# Patient Record
Sex: Female | Born: 2000 | Race: White | Hispanic: No | Marital: Single | Smoking: Never smoker
Health system: Southern US, Community
[De-identification: ages and names within clinical notes are randomized; demographics above are authoritative.]

## PROBLEM LIST (undated history)

## (undated) DIAGNOSIS — N946 Dysmenorrhea, unspecified: Secondary | ICD-10-CM

## (undated) DIAGNOSIS — Z23 Encounter for immunization: Secondary | ICD-10-CM

## (undated) DIAGNOSIS — F32A Depression, unspecified: Secondary | ICD-10-CM

## (undated) DIAGNOSIS — F419 Anxiety disorder, unspecified: Secondary | ICD-10-CM

## (undated) HISTORY — DX: Encounter for immunization: Z23

## (undated) HISTORY — DX: Anxiety disorder, unspecified: F41.9

## (undated) HISTORY — DX: Dysmenorrhea, unspecified: N94.6

## (undated) HISTORY — DX: Depression, unspecified: F32.A

---
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
## 2021-03-23 ENCOUNTER — Other Ambulatory Visit: Payer: Self-pay

## 2021-03-23 ENCOUNTER — Ambulatory Visit
Admission: RE | Admit: 2021-03-23 | Discharge: 2021-03-23 | Disposition: A | Discharge status: Home / Self Care | Payer: Commercial Managed Care - PPO | Source: Ambulatory Visit

## 2021-03-23 VITALS — BP 136/84 | HR 88 | Temp 99.1°F | Resp 20

## 2021-03-23 DIAGNOSIS — L03115 Cellulitis of right lower limb: Secondary | ICD-10-CM

## 2021-03-23 MED ORDER — SULFAMETHOXAZOLE-TRIMETHOPRIM 800-160 MG PO TABS: 1.0000 | ORAL_TABLET | Freq: Two times a day (BID) | ORAL | 0 refills | Status: AC

## 2021-03-23 NOTE — ED Triage Notes (Signed)
Pt here with rash to right lateral lower leg x 3 days. It is warm to touch, deep erythema and pain is worsening and spreading.

## 2021-03-23 NOTE — Discharge Instructions (Addendum)
Take the antibiotic as directed.  Follow up with your primary care provider for a recheck in 2-3 days.  Go to the emergency department if you have increased redness or other signs of infection.

## 2021-03-23 NOTE — ED Provider Notes (Signed)
[facility]    CSN: [id number] Arrival date & time: 03/23/21  1145      History   Chief Complaint Chief Complaint  Patient presents with   Rash    HPI Kaitlin Larsen is a 21 y.o. adult.  Patient presents with redness and pain on right lower leg x3 days.  No known injury.  The area started out as a possible insect bite but has gotten more painful and red.  No fever, open wounds, drainage, numbness, weakness, paresthesias, or other symptoms.  No treatments at home.  The history is provided by the patient.   Past Medical History:  Diagnosis Date   Dysmenorrhea in adolescent    improved with OCPs   Vaccine for human papilloma virus (HPV) types 6, 11, 16, and 18 administered     Patient Active Problem List   Diagnosis Date Noted   Dysmenorrhea in adolescent 05/24/2017    History reviewed. No pertinent surgical history.  OB History     Gravida  0   Para  0   Term  0   Preterm  0   AB  0   Living  0      SAB  0   IAB  0   Ectopic  0   Multiple  0   Live Births  0            Home Medications    Prior to Admission medications   Medication Sig Start Date End Date Taking? Authorizing Provider  sulfamethoxazole-trimethoprim (BACTRIM DS) 800-160 MG tablet Take 1 tablet by mouth 2 (two) times daily for 7 days. 03/23/21 03/30/21 Yes Mickie Bail, NP  benzonatate (TESSALON) 100 MG capsule Take 1 capsule (100 mg total) by mouth 3 (three) times daily as needed for cough. 12/05/20   Mickie Bail, NP  sertraline (ZOLOFT) 100 MG tablet Take 100 mg by mouth 2 (two) times daily. 03/22/20   [provider]  testosterone cypionate (DEPOTESTOSTERONE CYPIONATE) 200 MG/ML injection SMARTSIG:0.4 Milliliter(s) SUB-Q Every 2 Weeks 03/03/21   [provider]    Family History Family History  Problem Relation Age of Onset   Hyperlipidemia Mother    Hypertension Mother    Heart disease Maternal Grandmother    Breast cancer Neg Hx    Ovarian  cancer Neg Hx     Social History Social History   Tobacco Use   Smoking status: Never   Smokeless tobacco: Never  Vaping Use   Vaping Use: Never used  Substance Use Topics   Alcohol use: Never   Drug use: Never     Allergies   Patient has no known allergies.   Review of Systems Review of Systems  Constitutional:  Negative for chills and fever.  Musculoskeletal:  Negative for arthralgias, gait problem and joint swelling.  Skin:  Positive for color change and wound.  Neurological:  Negative for weakness and numbness.  All other systems reviewed and are negative.   Physical Exam Triage Vital Signs ED Triage Vitals  Enc Vitals Group     BP      Pulse      Resp      Temp      Temp src      SpO2      Weight      Height      Head Circumference      Peak Flow      Pain Score      Pain Loc  Pain Edu?      Excl. in GC?    No data found.  Updated Vital Signs BP 136/84    Pulse 88    Temp 99.1 F (37.3 C)    Resp 20    SpO2 98%   Visual Acuity Right Eye Distance:   Left Eye Distance:   Bilateral Distance:    Right Eye Near:   Left Eye Near:    Bilateral Near:     Physical Exam Vitals and nursing note reviewed.  Constitutional:      General: He is not in acute distress.    Appearance: He is well-developed. He is not ill-appearing.  HENT:     Mouth/Throat:     Mouth: Mucous membranes are moist.  Cardiovascular:     Rate and Rhythm: Normal rate and regular rhythm.     Heart sounds: Normal heart sounds.  Pulmonary:     Effort: Pulmonary effort is normal. No respiratory distress.     Breath sounds: Normal breath sounds.  Musculoskeletal:        General: No swelling or deformity. Normal range of motion.     Cervical back: Neck supple.  Skin:    General: Skin is warm and dry.     Capillary Refill: Capillary refill takes less than 2 seconds.     Findings: Erythema present.     Comments: 1 cm area of tender induration with surrounding erythema.  See  pictures.   Neurological:     General: No focal deficit present.     Mental Status: He is alert and oriented to person, place, and time.     Sensory: No sensory deficit.     Motor: No weakness.     Gait: Gait normal.  Psychiatric:        Mood and Affect: Mood normal.        Behavior: Behavior normal.        UC Treatments / Results  Labs (all labs ordered are listed, but only abnormal results are displayed) Labs Reviewed - No data to display  EKG   Radiology No results found.  Procedures Procedures (including critical care time)  Medications Ordered in UC Medications - No data to display  Initial Impression / Assessment and Plan / UC Course  I have reviewed the triage vital signs and the nursing notes.  Pertinent labs & imaging results that were available during my care of the patient were reviewed by me and considered in my medical decision making (see chart for details).    Cellulitis of right lower leg, possibly due to insect bite.  Treating with Bactrim.  Discussed signs of worsening infection.  Instructed patient to follow-up with PCP for recheck in 2 to 3 days.  ED precautions discussed and education provided on cellulitis.  Patient agrees to plan of care.  Final Clinical Impressions(s) / UC Diagnoses   Final diagnoses:  Cellulitis of right lower leg     Discharge Instructions      Take the antibiotic as directed.  Follow up with your primary care provider for a recheck in 2-3 days.  Go to the emergency department if you have increased redness or other signs of infection.         ED Prescriptions     Medication Sig Dispense Auth. Provider   sulfamethoxazole-trimethoprim (BACTRIM DS) 800-160 MG tablet Take 1 tablet by mouth 2 (two) times daily for 7 days. 14 tablet Mickie Bail, NP      PDMP  not reviewed this encounter.   Mickie Bail, NP 03/23/21 1245

## 2021-09-16 IMAGING — CR DG ABDOMEN 1V
1 series · 2 of 2 positions shown · non-contrast
Comparison: None.

CLINICAL DATA: Left lower quadrant pain

EXAM:
ABDOMEN - 1 VIEW

[Series 1: t abdomen supine · 0.14mm/px · 2 of 2 slices shown]
[im 1/2]
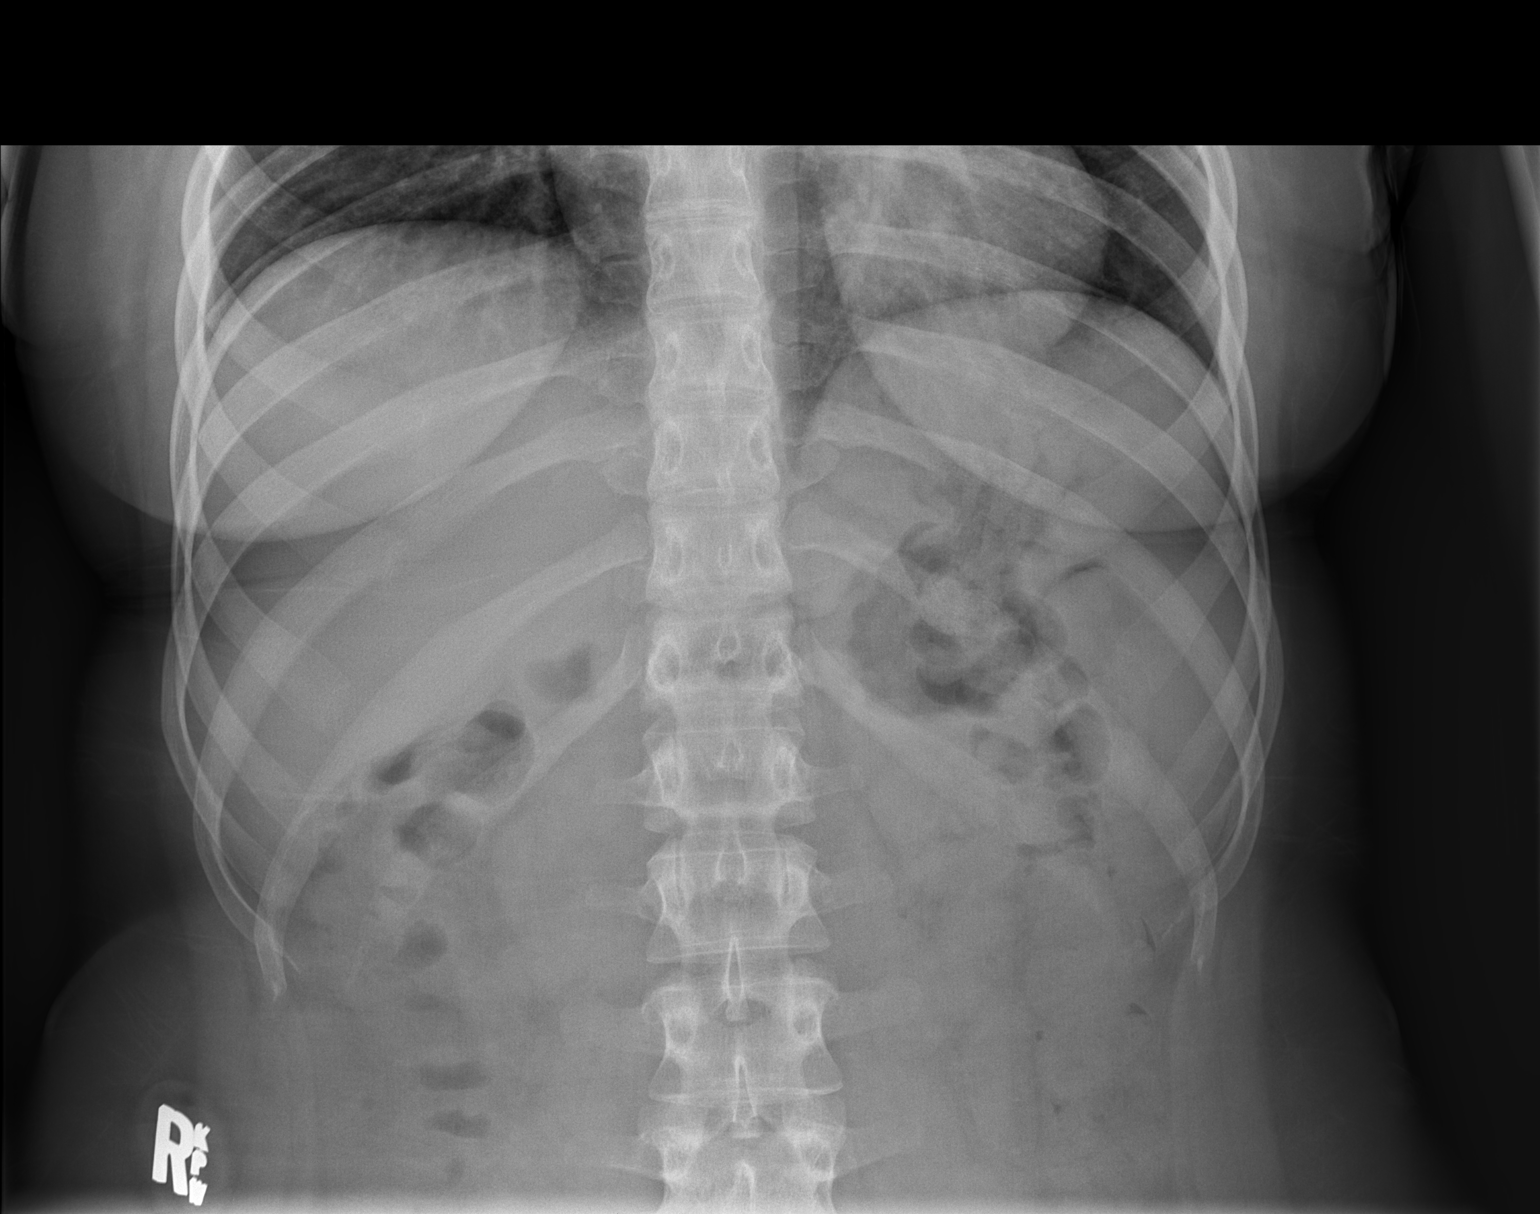
[im 2/2]
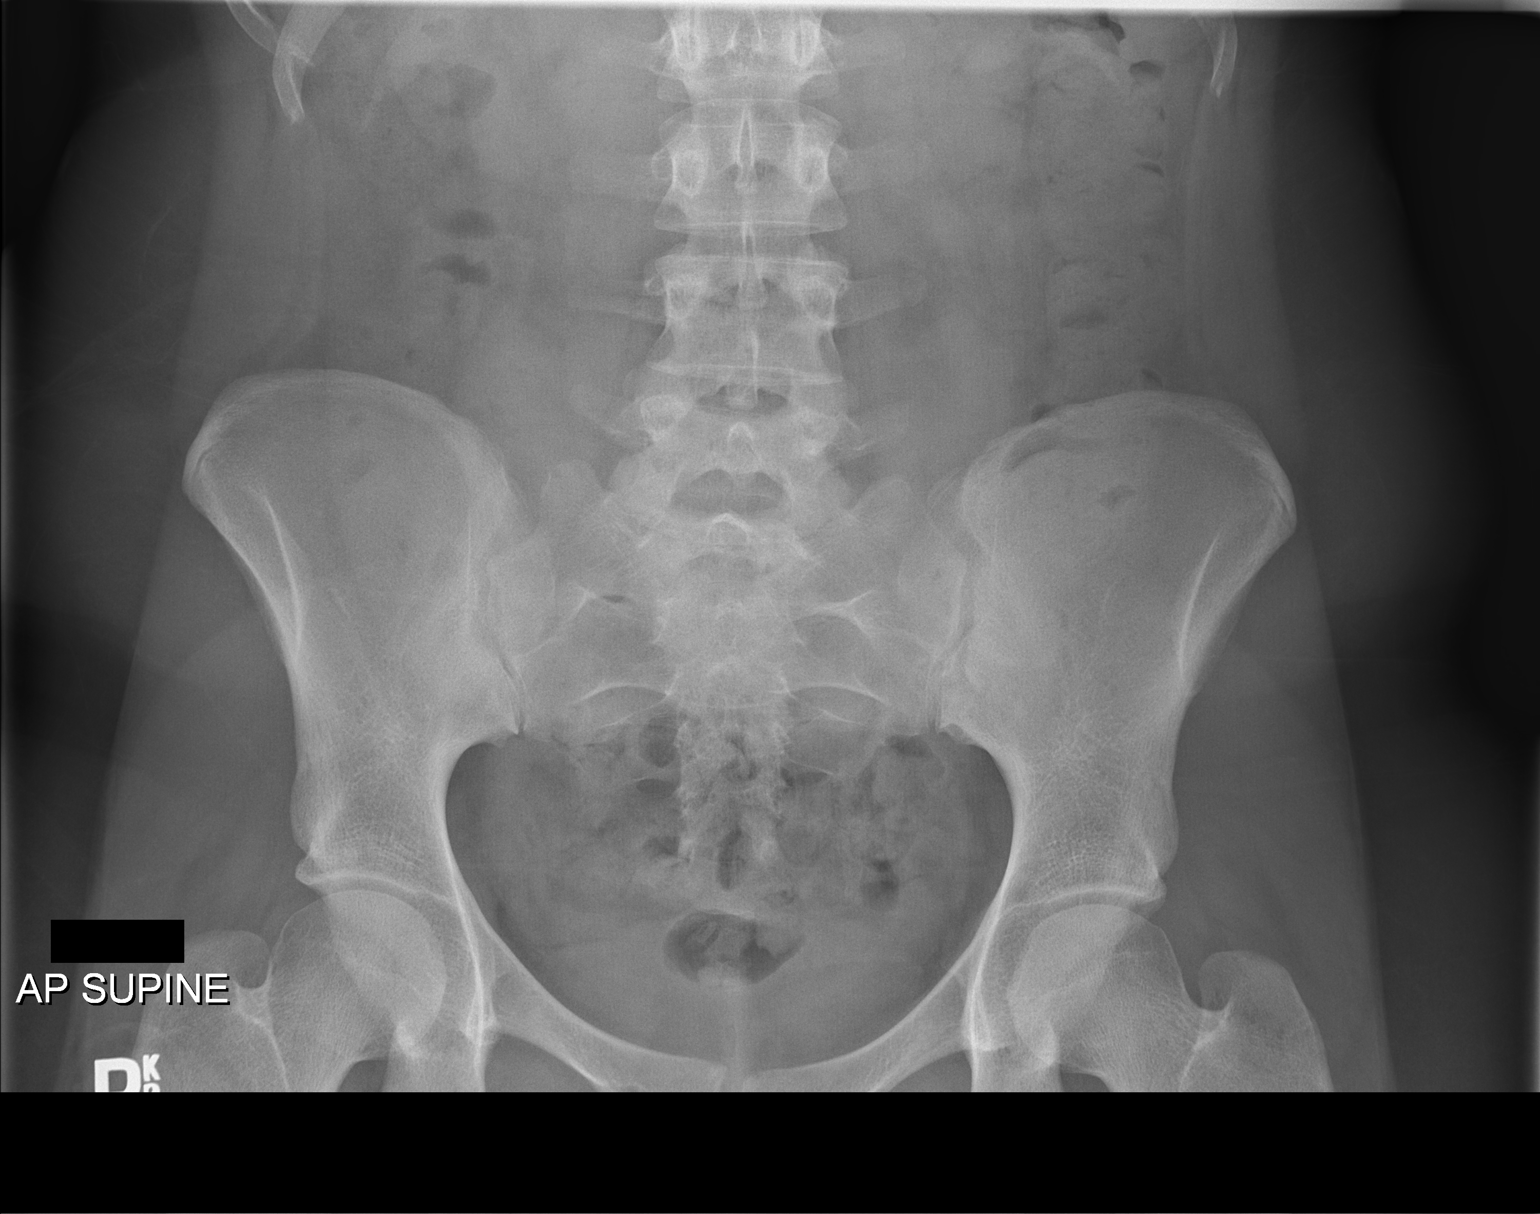

[2 of 2 positions shown; findings below may reference images not displayed]

FINDINGS: The bowel gas pattern is normal. No radio-opaque calculi or other
significant radiographic abnormality are seen.
IMPRESSION: Negative.

## 2021-11-24 ENCOUNTER — Other Ambulatory Visit: Payer: Self-pay

## 2021-11-24 ENCOUNTER — Encounter: Payer: Self-pay | Admitting: Nurse Practitioner

## 2021-11-24 ENCOUNTER — Ambulatory Visit (INDEPENDENT_AMBULATORY_CARE_PROVIDER_SITE_OTHER): Payer: Commercial Managed Care - PPO | Admitting: Nurse Practitioner

## 2021-11-24 VITALS — BP 124/76 | HR 94 | Temp 97.8°F | Resp 18 | Ht 66.0 in | Wt 221.1 lb

## 2021-11-24 DIAGNOSIS — G43019 Migraine without aura, intractable, without status migrainosus: Secondary | ICD-10-CM | POA: Diagnosis not present

## 2021-11-24 DIAGNOSIS — Z13220 Encounter for screening for lipoid disorders: Secondary | ICD-10-CM

## 2021-11-24 DIAGNOSIS — Z131 Encounter for screening for diabetes mellitus: Secondary | ICD-10-CM

## 2021-11-24 DIAGNOSIS — Z1159 Encounter for screening for other viral diseases: Secondary | ICD-10-CM

## 2021-11-24 DIAGNOSIS — F3342 Major depressive disorder, recurrent, in full remission: Secondary | ICD-10-CM | POA: Insufficient documentation

## 2021-11-24 DIAGNOSIS — Z130 Encounter for screening for diseases of the blood and blood-forming organs and certain disorders involving the immune mechanism: Secondary | ICD-10-CM | POA: Diagnosis not present

## 2021-11-24 DIAGNOSIS — F419 Anxiety disorder, unspecified: Secondary | ICD-10-CM

## 2021-11-24 DIAGNOSIS — Z7689 Persons encountering health services in other specified circumstances: Secondary | ICD-10-CM

## 2021-11-24 DIAGNOSIS — Z114 Encounter for screening for human immunodeficiency virus [HIV]: Secondary | ICD-10-CM

## 2021-11-24 MED ORDER — SUMATRIPTAN SUCCINATE 25 MG PO TABS: 25.0000 mg | ORAL_TABLET | ORAL | 0 refills | Status: DC | PRN

## 2021-11-24 NOTE — Assessment & Plan Note (Signed)
In remission, has not had any issues with this for awhile.

## 2021-11-24 NOTE — Progress Notes (Signed)
BP 124/76   Pulse 94   Temp 97.8 F (36.6 C) (Oral)   Resp 18   Ht 5\' 6"  (1.676 m)   Wt 221 lb 1.6 oz (100.3 kg)   SpO2 98%   BMI 35.69 kg/m    Subjective:    Patient ID: Kaitlin Larsen, adult    DOB: [birth year], 21 y.o.   MRN: [id number]  HPI: Kaitlin Larsen is a 21 y.o. adult  Chief Complaint  Patient presents with   Establish Care   Migraine   Establish care:  Patient's last physical was several years ago. Patient history includes anxiety, depression.  Family history includes mom has HTN, HLD, dad no history, no family history of cancer.   Migraines: Patient reports that migraines for years. Used to take excedrin migraine and would help.  Reports recently they have increased.  Excedrin is only helping 50 % of the time.  Denies any aura.  Starts as a dull pain and then increases over a few hours typically on the left side. Does report nausea.  Discussed options would like to try Imitrex.  Discussed administration.    Depression/anxiety:  Patient reports that mood has not been an issue for sometime.  Patient reports used to be on medication but does not need it anymore.     11/24/2021    1:12 PM  Depression screen PHQ 2/9  Decreased Interest 0  Down, Depressed, Hopeless 0  PHQ - 2 Score 0  Altered sleeping 0  Tired, decreased energy 1  Change in appetite 0  Feeling bad or failure about yourself  0  Trouble concentrating 0  Moving slowly or fidgety/restless 0  Suicidal thoughts 0  PHQ-9 Score 1  Difficult doing work/chores Not difficult at all       11/24/2021    1:13 PM  GAD 7 : Generalized Anxiety Score  Nervous, Anxious, on Edge 0  Control/stop worrying 0  Worry too much - different things 0  Trouble relaxing 0  Restless 0  Easily annoyed or irritable 1  Afraid - awful might happen 0  Total GAD 7 Score 1  Anxiety Difficulty Not difficult at all   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  Is [facility] at [facility] in  [place].  They follow all screening including pap and follow up care regarding testosterone.    Relevant past medical, surgical, family and social history reviewed and updated as indicated. Interim medical history since our last visit reviewed. Allergies and medications reviewed and updated.  Review of Systems  Constitutional: Negative for fever or weight change.  Respiratory: Negative for cough and shortness of breath.   Cardiovascular: Negative for chest pain or palpitations.  Gastrointestinal: Negative for abdominal pain, no bowel changes.  Musculoskeletal: Negative for gait problem or joint swelling.  Skin: Negative for rash.  Neurological: Negative for dizziness, positive for headache.  No other specific complaints in a complete review of systems (except as listed in HPI above).      Objective:    BP 124/76   Pulse 94   Temp 97.8 F (36.6 C) (Oral)   Resp 18   Ht 5\' 6"  (1.676 m)   Wt 221 lb 1.6 oz (100.3 kg)   SpO2 98%   BMI 35.69 kg/m   Wt Readings from Last 3 Encounters:  11/24/21 221 lb 1.6 oz (100.3 kg)  04/03/20 188 lb (85.3 kg) (96 %, Z= 1.78)*  09/24/18 177 lb 9.6 oz (80.6 kg) (  95 %, Z= 1.67)*   * Growth percentiles are based on CDC (Girls, 2-20 Years) data.    Physical Exam  Constitutional: Patient appears well-developed and well-nourished. Obese  No distress.  HEENT: head atraumatic, normocephalic, pupils equal and reactive to light, neck supple Cardiovascular: Normal rate, regular rhythm and normal heart sounds.  No murmur heard. No BLE edema. Pulmonary/Chest: Effort normal and breath sounds normal. No respiratory distress. Abdominal: Soft.  There is no tenderness. Psychiatric: Patient has a normal mood and affect. behavior is normal. Judgment and thought content normal.     Assessment & Plan:   Problem List Items Addressed This Visit       Cardiovascular and Mediastinum   Intractable migraine without aura and without status migrainosus - Primary     Patient reports migraines worsening excedrin migraine not helping any more. Will trial imitrex.  Discussed preventative care patient would like to hold off on that at this time.       Relevant Medications   SUMAtriptan (IMITREX) 25 MG tablet     Other   Recurrent major depressive disorder, in full remission ([place])    In remission, has not had any issues with this for awhile.       Anxiety    In remission, has not had any issues with this for awhile.       Other Visit Diagnoses     Screening for diabetes mellitus       Relevant Orders   COMPLETE METABOLIC PANEL WITH GFR   Hemoglobin A1c   Screening for cholesterol level       Relevant Orders   Lipid panel   Screening for deficiency anemia       Relevant Orders   CBC with Differential/Platelet   Encounter for hepatitis C screening test for low risk patient       Relevant Orders   Hepatitis C antibody   Screening for HIV without presence of risk factors       Relevant Orders   HIV Antibody (routine testing w rflx)   Encounter to establish care       schedule cpe         Follow up plan: Return in about 6 months (around 05/26/2022) for cpe.

## 2021-11-24 NOTE — Assessment & Plan Note (Signed)
In remission, has not had any issues with this for awhile.  

## 2021-11-24 NOTE — Assessment & Plan Note (Signed)
Patient reports migraines worsening excedrin migraine not helping any more. Will trial imitrex.  Discussed preventative care patient would like to hold off on that at this time.

## 2021-11-25 LAB — COMPLETE METABOLIC PANEL WITH GFR
AG Ratio: 1.8 (calc) (ref 1.0–2.5)
ALT: 19 U/L (ref 6–29)
AST: 20 U/L (ref 10–30)
Albumin: 4.6 g/dL (ref 3.6–5.1)
Alkaline phosphatase (APISO): 78 U/L (ref 31–125)
BUN/Creatinine Ratio: 10 (calc) (ref 6–22)
BUN: 10 mg/dL (ref 7–25)
CO2: 24 mmol/L (ref 20–32)
Calcium: 9.8 mg/dL (ref 8.6–10.2)
Chloride: 103 mmol/L (ref 98–110)
Creat: 0.99 mg/dL — ABNORMAL HIGH (ref 0.50–0.96)
Globulin: 2.5 g/dL (calc) (ref 1.9–3.7)
Glucose, Bld: 82 mg/dL (ref 65–99)
Potassium: 4.2 mmol/L (ref 3.5–5.3)
Sodium: 138 mmol/L (ref 135–146)
Total Bilirubin: 0.4 mg/dL (ref 0.2–1.2)
Total Protein: 7.1 g/dL (ref 6.1–8.1)
eGFR: 84 mL/min/{1.73_m2} (ref >=60)

## 2021-11-25 LAB — LIPID PANEL
Cholesterol: 152 mg/dL (ref <200)
HDL: 37 mg/dL — ABNORMAL LOW (ref >=50)
LDL Cholesterol (Calc): 93 mg/dL (calc)
Non-HDL Cholesterol (Calc): 115 mg/dL (calc) (ref <130)
Total CHOL/HDL Ratio: 4.1 (calc) (ref <5.0)
Triglycerides: 127 mg/dL (ref <150)

## 2021-11-25 LAB — CBC WITH DIFFERENTIAL/PLATELET
Absolute Monocytes: 618 cells/uL (ref 200–950)
Basophils Absolute: 12 cells/uL (ref 0–200)
Basophils Relative: 0.2 %
Eosinophils Absolute: 108 cells/uL (ref 15–500)
Eosinophils Relative: 1.8 %
HCT: 46.5 % — ABNORMAL HIGH (ref 35.0–45.0)
Hemoglobin: 15.4 g/dL (ref 11.7–15.5)
Lymphs Abs: 2670 cells/uL (ref 850–3900)
MCH: 28.6 pg (ref 27.0–33.0)
MCHC: 33.1 g/dL (ref 32.0–36.0)
MCV: 86.4 fL (ref 80.0–100.0)
MPV: 9.6 fL (ref 7.5–12.5)
Monocytes Relative: 10.3 %
Neutro Abs: 2592 cells/uL (ref 1500–7800)
Neutrophils Relative %: 43.2 %
Platelets: 247 10*3/uL (ref 140–400)
RBC: 5.38 10*6/uL — ABNORMAL HIGH (ref 3.80–5.10)
RDW: 14.2 % (ref 11.0–15.0)
Total Lymphocyte: 44.5 %
WBC: 6 10*3/uL (ref 3.8–10.8)

## 2021-11-25 LAB — HIV ANTIBODY (ROUTINE TESTING W REFLEX): HIV 1&2 Ab, 4th Generation: NONREACTIVE

## 2021-11-25 LAB — HEMOGLOBIN A1C
Hgb A1c MFr Bld: 5 % of total Hgb (ref <5.7)
Mean Plasma Glucose: 97 mg/dL
eAG (mmol/L): 5.4 mmol/L

## 2021-11-25 LAB — HEPATITIS C ANTIBODY: Hepatitis C Ab: NONREACTIVE

## 2021-12-21 ENCOUNTER — Other Ambulatory Visit: Payer: Self-pay | Admitting: Nurse Practitioner

## 2021-12-21 DIAGNOSIS — G43019 Migraine without aura, intractable, without status migrainosus: Secondary | ICD-10-CM

## 2021-12-21 NOTE — Telephone Encounter (Signed)
Requested Prescriptions  Pending Prescriptions Disp Refills   SUMAtriptan (IMITREX) 25 MG tablet [Pharmacy Med Name: SUMATRIPTAN SUCC 25 MG TABLET] 10 tablet 1    Sig: TAKE 1 EVERY 2 HOURS AS NEEDED FOR MIGRAINE. MAY REPEAT IN 2 HOURS IF HEADACHE PERSISTS OR RECURS.     Neurology:  Migraine Therapy - Triptan Passed - 12/21/2021  2:05 AM      Passed - Last BP in normal range    BP Readings from Last 1 Encounters:  11/24/21 124/76         Passed - Valid encounter within last 12 months    Recent Outpatient Visits           3 weeks ago Intractable migraine without aura and without status migrainosus   [facility] Berniece Salines, FNP   1 year ago No-show for appointment   [facility] Flinchum, Eula Fried, FNP       Future Appointments             In 5 months Zane Herald, Rudolpho Sevin, FNP [facility], [facility]

## 2022-05-08 ENCOUNTER — Ambulatory Visit
Admission: EM | Admit: 2022-05-08 | Discharge: 2022-05-08 | Disposition: A | Discharge status: Home / Self Care | Payer: Commercial Managed Care - PPO

## 2022-05-08 DIAGNOSIS — H6692 Otitis media, unspecified, left ear: Secondary | ICD-10-CM | POA: Diagnosis not present

## 2022-05-08 DIAGNOSIS — J0100 Acute maxillary sinusitis, unspecified: Secondary | ICD-10-CM | POA: Diagnosis not present

## 2022-05-08 DIAGNOSIS — J209 Acute bronchitis, unspecified: Secondary | ICD-10-CM

## 2022-05-08 MED ORDER — AMOXICILLIN 875 MG PO TABS: 875.0000 mg | ORAL_TABLET | Freq: Two times a day (BID) | ORAL | 0 refills | Status: AC

## 2022-05-08 NOTE — ED Triage Notes (Signed)
Patient presents to UC for sore throat, runny nose, rib pain with  coughing since 4 days. Had virtual visit on Thursday was treated with symptoms with prednisone, cough meds with no improvement.

## 2022-05-08 NOTE — ED Provider Notes (Signed)
[facility]    CSN: [id number] Arrival date & time: 05/08/22  1406      History   Chief Complaint Chief Complaint  Patient presents with   Cough   Nasal Congestion   Sore Throat    HPI Kaitlin Larsen is a 22 y.o. adult.  Patient presents with 4-day history of runny nose, sore throat, cough.  Treated with prednisone and Tessalon Perles from a virtual visit on 05/05/2022.  No fever, rash, shortness of breath, vomiting, diarrhea, or other symptoms.  The history is provided by the patient and medical records.    Past Medical History:  Diagnosis Date   Anxiety    Depression    Dysmenorrhea in adolescent    improved with OCPs   Vaccine for human papilloma virus (HPV) types 6, 11, 16, and 18 administered     Patient Active Problem List   Diagnosis Date Noted   Intractable migraine without aura and without status migrainosus 11/24/2021   Recurrent major depressive disorder, in full remission 11/24/2021   Anxiety 11/24/2021   Dysmenorrhea in adolescent 05/24/2017    History reviewed. No pertinent surgical history.  OB History     Gravida  0   Para  0   Term  0   Preterm  0   AB  0   Living  0      SAB  0   IAB  0   Ectopic  0   Multiple  0   Live Births  0            Home Medications    Prior to Admission medications   Medication Sig Start Date End Date Taking? Authorizing Provider  amoxicillin (AMOXIL) 875 MG tablet Take 1 tablet (875 mg total) by mouth 2 (two) times daily for 10 days. 05/08/22 05/18/22 Yes Mickie Bailate, Nyzir Dubois H, NP  benzonatate (TESSALON) 200 MG capsule Take 200 mg by mouth 3 (three) times daily as needed. 05/05/22  Yes [provider]  predniSONE (DELTASONE) 20 MG tablet Take 20 mg by mouth daily. 05/05/22  Yes [provider]  SUMAtriptan (IMITREX) 25 MG tablet TAKE 1 EVERY 2 HOURS AS NEEDED FOR MIGRAINE. MAY REPEAT IN 2 HOURS IF HEADACHE PERSISTS OR RECURS. 12/21/21   Berniece SalinesPender, Julie F, FNP  testosterone cypionate  (DEPOTESTOSTERONE CYPIONATE) 200 MG/ML injection SMARTSIG:0.4 Milliliter(s) SUB-Q Every 2 Weeks 03/03/21   [provider]    Family History Family History  Problem Relation Age of Onset   Hyperlipidemia Mother    Hypertension Mother    Heart disease Maternal Grandmother    Breast cancer Neg Hx    Ovarian cancer Neg Hx     Social History Social History   Tobacco Use   Smoking status: Never   Smokeless tobacco: Never  Vaping Use   Vaping Use: Every day  Substance Use Topics   Alcohol use: Never   Drug use: Never     Allergies   Patient has no known allergies.   Review of Systems Review of Systems  Constitutional:  Negative for chills and fever.  HENT:  Positive for congestion, postnasal drip, rhinorrhea and sore throat. Negative for ear pain.   Respiratory:  Positive for cough. Negative for shortness of breath.   Cardiovascular:  Negative for chest pain and palpitations.  All other systems reviewed and are negative.    Physical Exam Triage Vital Signs ED Triage Vitals  Enc Vitals Group     BP 05/08/22 1419 (!) 145/87  Pulse Rate 05/08/22 1419 (!) 117     Resp 05/08/22 1419 18     Temp 05/08/22 1419 99.3 F (37.4 C)     Temp Source 05/08/22 1419 Temporal     SpO2 05/08/22 1419 97 %     Weight --      Height --      Head Circumference --      Peak Flow --      Pain Score 05/08/22 1418 0     Pain Loc --      Pain Edu? --      Excl. in GC? --    No data found.  Updated Vital Signs BP (!) 145/87 (BP Location: Left Arm)   Pulse (!) 117   Temp 99.3 F (37.4 C) (Temporal)   Resp 18   SpO2 97%   Visual Acuity Right Eye Distance:   Left Eye Distance:   Bilateral Distance:    Right Eye Near:   Left Eye Near:    Bilateral Near:     Physical Exam Vitals and nursing note reviewed.  Constitutional:      General: He is not in acute distress.    Appearance: Normal appearance. He is well-developed. He is not ill-appearing.  HENT:     Right  Ear: Tympanic membrane normal.     Left Ear: Tympanic membrane is erythematous.     Nose: Congestion and rhinorrhea present.     Mouth/Throat:     Mouth: Mucous membranes are moist.     Pharynx: Posterior oropharyngeal erythema present.  Cardiovascular:     Rate and Rhythm: Normal rate and regular rhythm.     Heart sounds: Normal heart sounds.  Pulmonary:     Effort: Pulmonary effort is normal. No respiratory distress.     Breath sounds: Wheezing present.     Comments: Few faint expiratory wheezes.  Musculoskeletal:     Cervical back: Neck supple.  Skin:    General: Skin is warm and dry.  Neurological:     Mental Status: He is alert.  Psychiatric:        Mood and Affect: Mood normal.        Behavior: Behavior normal.      UC Treatments / Results  Labs (all labs ordered are listed, but only abnormal results are displayed) Labs Reviewed - No data to display  EKG   Radiology No results found.  Procedures Procedures (including critical care time)  Medications Ordered in UC Medications - No data to display  Initial Impression / Assessment and Plan / UC Course  I have reviewed the triage vital signs and the nursing notes.  Pertinent labs & imaging results that were available during my care of the patient were reviewed by me and considered in my medical decision making (see chart for details).   Left otitis media, acute sinusitis, acute bronchitis.  Patient has an albuterol inhaler and is already on prednisone.  Treating today with amoxicillin.  Education provided on otitis media, sinus infection, bronchitis.  Instructed patient to follow-up with PCP tomorrow.  Patient agrees to plan of care.  Per patient request, pronoun changed on work note to "she."  Final Clinical Impressions(s) / UC Diagnoses   Final diagnoses:  Left otitis media, unspecified otitis media type  Acute non-recurrent maxillary sinusitis  Acute bronchitis, unspecified organism     Discharge  Instructions      Take the amoxicillin as directed.  Continue using your albuterol inhaler and take the  prednisone as prescribed.    Follow up with your primary care provider.        ED Prescriptions     Medication Sig Dispense Auth. Provider   amoxicillin (AMOXIL) 875 MG tablet Take 1 tablet (875 mg total) by mouth 2 (two) times daily for 10 days. 20 tablet Mickie Bail, NP      PDMP not reviewed this encounter.   Mickie Bail, NP 05/08/22 [id number]

## 2022-05-08 NOTE — Discharge Instructions (Addendum)
Take the amoxicillin as directed.  Continue using your albuterol inhaler and take the prednisone as prescribed.    Follow up with your primary care provider.

## 2022-05-23 NOTE — Progress Notes (Unsigned)
Name: Kaitlin Larsen   MRN: [id number]    DOB: 10/19/2000   Date:05/24/2022       Progress Note  Subjective  Chief Complaint  Chief Complaint  Patient presents with   Annual Exam    HPI  Patient presents for annual CPE.  Diet: well balanced diet Exercise: work is physical, he reports that that work has opened a gym that he will start going to. Recommendations for 150 min of physical activity a week Sleep: 6 hours Last dental exam:awhile ago Last eye exam: 2022  Flowsheet Row Office Visit from 05/24/2022 in [facility]  AUDIT-C Score 0      Depression: Phq 9 is  negative    05/24/2022    1:35 PM 05/24/2022    1:26 PM 11/24/2021    1:12 PM  Depression screen PHQ 2/9  Decreased Interest 0 0 0  Down, Depressed, Hopeless 0 0 0  PHQ - 2 Score 0 0 0  Altered sleeping 0  0  Tired, decreased energy 0  1  Change in appetite 0  0  Feeling bad or failure about yourself  0  0  Trouble concentrating 0  0  Moving slowly or fidgety/restless 0  0  Suicidal thoughts 0  0  PHQ-9 Score 0  1  Difficult doing work/chores Not difficult at all  Not difficult at all   Hypertension: BP Readings from Last 3 Encounters:  05/24/22 118/72  05/08/22 (!) 145/87  11/24/21 124/76   Obesity: Wt Readings from Last 3 Encounters:  05/24/22 207 lb 4.8 oz (94 kg)  11/24/21 221 lb 1.6 oz (100.3 kg)  04/03/20 188 lb (85.3 kg) (96 %, Z= 1.78)*   * Growth percentiles are based on CDC (Girls, 2-20 Years) data.   BMI Readings from Last 3 Encounters:  05/24/22 33.46 kg/m  11/24/21 35.69 kg/m  04/03/20 31.28 kg/m (95 %, Z= 1.67)*   * Growth percentiles are based on CDC (Girls, 2-20 Years) data.     Vaccines:  HPV: up to at age 51 , ask insurance if age between 31-45  Shingrix: 73-64 yo and ask insurance if covered when patient above 8 yo Pneumonia:  educated and discussed with patient. Flu:  educated and discussed with patient.  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 Patient does not qualify.   ECG: none  Advanced Care Planning: A voluntary discussion about advance care planning including the explanation and discussion of advance directives.  Discussed health care proxy and Living will, and the patient was able to identify a health care proxy as parents.  Patient does not have a living will at present time. If patient does have living will, I have requested they bring this to the clinic to be scanned in to their chart.  Lipids: Lab Results  Component Value Date   CHOL 152 11/24/2021   Lab Results  Component Value Date   HDL 37 (L)  11/24/2021   Lab Results  Component Value Date   LDLCALC 93 11/24/2021   Lab Results  Component Value Date   TRIG 127 11/24/2021   Lab Results  Component Value Date   CHOLHDL 4.1 11/24/2021   No results found for: "LDLDIRECT"  Glucose: Glucose, Bld  Date Value Ref Range Status  11/24/2021 82 65 - 99 mg/dL Final    Comment:    .            Fasting reference interval .     Patient Active Problem List   Diagnosis Date Noted   Intractable migraine without aura and without status migrainosus 11/24/2021   Recurrent major depressive disorder, in full remission 11/24/2021   Anxiety 11/24/2021   Dysmenorrhea in adolescent 05/24/2017     No past surgical history on file.  Family History  Problem Relation Age of Onset   Hyperlipidemia Mother    Hypertension Mother    Heart disease Maternal Grandmother    Breast cancer Neg Hx    Ovarian cancer Neg Hx     Social History   Socioeconomic History   Marital status: Single    Spouse name: Not on file   Number of children: Not on file   Years of education: Not on file   Highest education level: Not on file  Occupational History   Not on file  Tobacco Use   Smoking status: Never   Smokeless tobacco: Never  Vaping Use   Vaping Use: Every day  Substance and Sexual Activity   Alcohol use: Never   Drug use: Never   Sexual activity: Yes    Birth control/protection: None    Comment: same sex partner  Other Topics Concern   Not on file  Social History Narrative   EMT with [facility]    Social Determinants of Health   Financial Resource Strain: Low Risk  (05/24/2022)   Overall Financial Resource Strain (CARDIA)    Difficulty of Paying Living Expenses: Not hard at all  Food Insecurity: No Food Insecurity (05/24/2022)   Hunger Vital Sign    Worried About Running Out of Food in the Last Year: Never true    Ran Out of Food in the Last Year: Never true  Transportation Needs: No Transportation Needs (05/24/2022)   PRAPARE - Administrator, Civil Service (Medical): No    Lack of Transportation (Non-Medical): No  Physical Activity: Sufficiently Active (05/24/2022)   Exercise Vital Sign    Days of Exercise per Week: 5 days    Minutes of Exercise per Session: 60 min  Stress: No Stress Concern Present (05/24/2022)   [employer] of Occupational Health - Occupational Stress Questionnaire    Feeling of Stress : Only a little  Social Connections: Socially Isolated (05/24/2022)   Social Connection and Isolation Panel [NHANES]    Frequency of Communication with Friends and Family: More than three times a week    Frequency of Social Gatherings with  Friends and Family: More than three times a week    Attends Religious Services: Never    Database administrator or Organizations: No    Attends Banker Meetings: Never    Marital Status: Never married  Intimate Partner Violence: Not At Risk (05/24/2022)   Humiliation, Afraid, Rape, and Kick questionnaire    Fear of Current or Ex-Partner: No    Emotionally Abused: No    Physically Abused: No    Sexually Abused: No  Current Outpatient Medications:    SUMAtriptan (IMITREX) 25 MG tablet, TAKE 1 EVERY 2 HOURS AS NEEDED FOR MIGRAINE. MAY REPEAT IN 2 HOURS IF HEADACHE PERSISTS OR RECURS., Disp: 10 tablet, Rfl: 1   testosterone cypionate (DEPOTESTOSTERONE CYPIONATE) 200 MG/ML injection, SMARTSIG:0.4 Milliliter(s) SUB-Q Every 2 Weeks, Disp: , Rfl:    benzonatate (TESSALON) 200 MG capsule, Take 200 mg by mouth 3 (three) times daily as needed. (Patient not taking: Reported on 05/24/2022), Disp: , Rfl:    predniSONE (DELTASONE) 20 MG tablet, Take 20 mg by mouth daily. (Patient not taking: Reported on 05/24/2022), Disp: , Rfl:   No Known Allergies   ROS  Constitutional: Negative for fever or weight change.  Respiratory: Negative for cough and shortness of breath.   Cardiovascular: Negative for chest pain or palpitations.  Gastrointestinal: Negative for abdominal pain, no bowel changes.  Musculoskeletal: Negative for gait problem or joint swelling.  Skin: Negative for rash.  Neurological: Negative for dizziness or headache.  No other specific complaints in a complete review of systems (except as listed in HPI above).   Objective  Vitals:   05/24/22 1322  BP: 118/72  Pulse: 76  Resp: 16  Temp: 98.1 F (36.7 C)  TempSrc: Oral  SpO2: 98%  Weight: 207 lb 4.8 oz (94 kg)  Height: 5\' 6"  (1.676 m)    Body mass index is 33.46 kg/m.  Physical Exam Constitutional: Patient appears well-developed and well-nourished. No distress.  HENT: Head: Normocephalic and atraumatic. Ears:  B TMs ok, no erythema or effusion; Nose: Nose normal. Mouth/Throat: Oropharynx is clear and moist. No oropharyngeal exudate.  Eyes: Conjunctivae and EOM are normal. Pupils are equal, round, and reactive to light. No scleral icterus.  Neck: Normal range of motion. Neck supple. No JVD present. No thyromegaly present.  Cardiovascular: Normal rate, regular rhythm and normal heart sounds.  No murmur heard. No BLE edema. Pulmonary/Chest: Effort normal and breath sounds normal. No respiratory distress. Abdominal: Soft. Bowel sounds are normal, no distension. There is no tenderness. no masses Breast: no lumps or masses, no nipple discharge or rashes Musculoskeletal: Normal range of motion, no joint effusions. No gross deformities Neurological: he is alert and oriented to person, place, and time. No cranial nerve deficit. Coordination, balance, strength, speech and gait are normal.  Skin: Skin is warm and dry. No rash noted. No erythema.  Psychiatric: Patient has a normal mood and affect. behavior is normal. Judgment and thought content normal.   No results found for this or any previous visit (from the past 2160 hour(s)).   Fall Risk:    05/24/2022    1:26 PM 11/24/2021    1:08 PM  Fall Risk   Falls in the past year? 0 0  Number falls in past yr: 0 0  Injury with Fall? 0 0  Follow up  Falls evaluation completed     Functional Status Survey: Is the patient deaf or have difficulty hearing?: No Does the patient have difficulty seeing, even when wearing glasses/contacts?: No Does the patient have difficulty concentrating, remembering, or making decisions?: No Does the patient have difficulty walking or climbing stairs?: No Does the patient have difficulty dressing or bathing?: No Does the patient have difficulty doing errands alone such as visiting a doctor's office or shopping?: No   Assessment & Plan  1. Annual physical exam Increase physical activity to 150 min a week Continue to eat  well balanced Labs recently done  -USPSTF grade A and B recommendations reviewed with  patient; age-appropriate recommendations, preventive care, screening tests, etc discussed and encouraged; healthy living encouraged; see AVS for patient education given to patient -Discussed importance of 150 minutes of physical activity weekly, eat two servings of fish weekly, eat one serving of tree nuts ( cashews, pistachios, pecans, almonds.Marland Kitchen) every other day, eat 6 servings of fruit/vegetables daily and drink plenty of water and avoid sweet beverages.

## 2022-05-24 ENCOUNTER — Ambulatory Visit (INDEPENDENT_AMBULATORY_CARE_PROVIDER_SITE_OTHER): Payer: Commercial Managed Care - PPO | Admitting: Nurse Practitioner

## 2022-05-24 ENCOUNTER — Other Ambulatory Visit: Payer: Self-pay

## 2022-05-24 ENCOUNTER — Encounter: Payer: Self-pay | Admitting: Nurse Practitioner

## 2022-05-24 VITALS — BP 118/72 | HR 76 | Temp 98.1°F | Resp 16 | Ht 66.0 in | Wt 207.3 lb

## 2022-05-24 DIAGNOSIS — Z0000 Encounter for general adult medical examination without abnormal findings: Secondary | ICD-10-CM | POA: Diagnosis not present

## 2022-06-28 ENCOUNTER — Encounter: Payer: Self-pay | Admitting: Nurse Practitioner

## 2022-07-26 NOTE — Progress Notes (Unsigned)
There were no vitals taken for this visit.   Subjective:    Patient ID: Kaitlin Larsen, adult    DOB: Feb 03, 2000, 22 y.o.   MRN: [id number]  HPI: Kaitlin Larsen is a 22 y.o. adult  No chief complaint on file.   Relevant past medical, surgical, family and social history reviewed and updated as indicated. Interim medical history since our last visit reviewed. Allergies and medications reviewed and updated.  Review of Systems Constitutional: Negative for fever or weight change.  Respiratory: Negative for cough and shortness of breath.   Cardiovascular: Negative for chest pain or palpitations.  Gastrointestinal: Negative for abdominal pain, no bowel changes.  Musculoskeletal: Negative for gait problem or joint swelling.  Skin: Negative for rash.  Neurological: Negative for dizziness or headache.  No other specific complaints in a complete review of systems (except as listed in HPI above).      Objective:    There were no vitals taken for this visit.  Wt Readings from Last 3 Encounters:  05/24/22 207 lb 4.8 oz (94 kg)  11/24/21 221 lb 1.6 oz (100.3 kg)  04/03/20 188 lb (85.3 kg) (96 %, Z= 1.78)*   * Growth percentiles are based on CDC (Girls, 2-20 Years) data.    Physical Exam  Constitutional: Patient appears well-developed and well-nourished. Obese *** No distress.  HEENT: head atraumatic, normocephalic, pupils equal and reactive to light, ears ***, neck supple, throat within normal limits Cardiovascular: Normal rate, regular rhythm and normal heart sounds.  No murmur heard. No BLE edema. Pulmonary/Chest: Effort normal and breath sounds normal. No respiratory distress. Abdominal: Soft.  There is no tenderness. Skin: *** Psychiatric: Patient has a normal mood and affect. behavior is normal. Judgment and thought content normal.  Results for orders placed or performed in visit on 11/24/21  Lipid panel  Result Value Ref Range   Cholesterol 152 <200 mg/dL   HDL 37 (L) > OR = 50  mg/dL   Triglycerides 829 <562 mg/dL   LDL Cholesterol (Calc) 93 mg/dL (calc)   Total CHOL/HDL Ratio 4.1 <5.0 (calc)   Non-HDL Cholesterol (Calc) 115 <130 mg/dL (calc)  CBC with Differential/Platelet  Result Value Ref Range   WBC 6.0 3.8 - 10.8 Thousand/uL   RBC 5.38 (H) 3.80 - 5.10 Million/uL   Hemoglobin 15.4 11.7 - 15.5 g/dL   HCT 13.0 (H) 86.5 - 78.4 %   MCV 86.4 80.0 - 100.0 fL   MCH 28.6 27.0 - 33.0 pg   MCHC 33.1 32.0 - 36.0 g/dL   RDW 69.6 29.5 - 28.4 %   Platelets 247 140 - 400 Thousand/uL   MPV 9.6 7.5 - 12.5 fL   Neutro Abs 2,592 1,500 - 7,800 cells/uL   Lymphs Abs 2,670 850 - 3,900 cells/uL   Absolute Monocytes 618 200 - 950 cells/uL   Eosinophils Absolute 108 15 - 500 cells/uL   Basophils Absolute 12 0 - 200 cells/uL   Neutrophils Relative % 43.2 %   Total Lymphocyte 44.5 %   Monocytes Relative 10.3 %   Eosinophils Relative 1.8 %   Basophils Relative 0.2 %  COMPLETE METABOLIC PANEL WITH GFR  Result Value Ref Range   Glucose, Bld 82 65 - 99 mg/dL   BUN 10 7 - 25 mg/dL   Creat 1.32 (H) 4.40 - 0.96 mg/dL   eGFR 84 > OR = 60 NU/UVO/5.36U4   BUN/Creatinine Ratio 10 6 - 22 (calc)   Sodium 138 135 - 146 mmol/L   Potassium  4.2 3.5 - 5.3 mmol/L   Chloride 103 98 - 110 mmol/L   CO2 24 20 - 32 mmol/L   Calcium 9.8 8.6 - 10.2 mg/dL   Total Protein 7.1 6.1 - 8.1 g/dL   Albumin 4.6 3.6 - 5.1 g/dL   Globulin 2.5 1.9 - 3.7 g/dL (calc)   AG Ratio 1.8 1.0 - 2.5 (calc)   Total Bilirubin 0.4 0.2 - 1.2 mg/dL   Alkaline phosphatase (APISO) 78 31 - 125 U/L   AST 20 10 - 30 U/L   ALT 19 6 - 29 U/L  Hemoglobin A1c  Result Value Ref Range   Hgb A1c MFr Bld 5.0 <5.7 % of total Hgb   Mean Plasma Glucose 97 mg/dL   eAG (mmol/L) 5.4 mmol/L  Hepatitis C antibody  Result Value Ref Range   Hepatitis C Ab NON-REACTIVE NON-REACTIVE  HIV Antibody (routine testing w rflx)  Result Value Ref Range   HIV 1&2 Ab, 4th Generation NON-REACTIVE NON-REACTIVE      Assessment & Plan:    Problem List Items Addressed This Visit   None    Follow up plan: No follow-ups on file.

## 2022-07-28 ENCOUNTER — Ambulatory Visit (INDEPENDENT_AMBULATORY_CARE_PROVIDER_SITE_OTHER): Payer: Commercial Managed Care - PPO | Admitting: Nurse Practitioner

## 2022-07-28 ENCOUNTER — Encounter: Payer: Self-pay | Admitting: Nurse Practitioner

## 2022-07-28 VITALS — BP 110/72 | HR 95 | Temp 98.2°F | Resp 18 | Ht 66.0 in | Wt 202.2 lb

## 2022-07-28 DIAGNOSIS — Z23 Encounter for immunization: Secondary | ICD-10-CM | POA: Diagnosis not present

## 2022-07-28 DIAGNOSIS — L0291 Cutaneous abscess, unspecified: Secondary | ICD-10-CM | POA: Diagnosis not present

## 2022-07-28 DIAGNOSIS — R21 Rash and other nonspecific skin eruption: Secondary | ICD-10-CM

## 2022-07-28 MED ORDER — SULFAMETHOXAZOLE-TRIMETHOPRIM 800-160 MG PO TABS
1.0000 | ORAL_TABLET | Freq: Two times a day (BID) | ORAL | 0 refills | Status: AC
Start: 2022-07-28 — End: 2022-08-04

## 2022-07-28 MED ORDER — CLOTRIMAZOLE-BETAMETHASONE 1-0.05 % EX CREA
1.0000 | TOPICAL_CREAM | Freq: Every day | CUTANEOUS | 0 refills | Status: AC
Start: 2022-07-28 — End: ?

## 2022-09-16 ENCOUNTER — Encounter: Payer: Self-pay | Admitting: Nurse Practitioner

## 2022-09-16 ENCOUNTER — Ambulatory Visit (INDEPENDENT_AMBULATORY_CARE_PROVIDER_SITE_OTHER): Payer: Commercial Managed Care - PPO | Admitting: Nurse Practitioner

## 2022-09-16 ENCOUNTER — Other Ambulatory Visit: Payer: Self-pay

## 2022-09-16 VITALS — BP 110/82 | HR 95 | Temp 98.1°F | Resp 18 | Ht 66.0 in | Wt 205.3 lb

## 2022-09-16 DIAGNOSIS — Z0289 Encounter for other administrative examinations: Secondary | ICD-10-CM

## 2022-09-16 DIAGNOSIS — Z111 Encounter for screening for respiratory tuberculosis: Secondary | ICD-10-CM | POA: Diagnosis not present

## 2022-09-16 DIAGNOSIS — R21 Rash and other nonspecific skin eruption: Secondary | ICD-10-CM

## 2022-09-16 DIAGNOSIS — Z131 Encounter for screening for diabetes mellitus: Secondary | ICD-10-CM | POA: Diagnosis not present

## 2022-09-16 DIAGNOSIS — Z130 Encounter for screening for diseases of the blood and blood-forming organs and certain disorders involving the immune mechanism: Secondary | ICD-10-CM

## 2022-09-16 NOTE — Progress Notes (Signed)
BP 110/82   Pulse 95   Temp 98.1 F (36.7 C) (Oral)   Resp 18   Ht 5\' 6"  (1.676 m)   Wt 205 lb 4.8 oz (93.1 kg)   SpO2 98%   BMI 33.14 kg/m    Subjective:    Patient ID: Shirlean Kelly, adult    DOB: 2001/01/08, 22 y.o.   MRN: [id number]  HPI: Cerra Marotta is a 22 y.o. adult  Chief Complaint  Patient presents with   paper work   Rash:  saw patient previously with rash under his arms.  He was given clotrimazole-betamethasone cream to help. He reports that it has helped but keeps coming back. Will place referral to dermatology.   Paperwork: patient here to have paperwork filled out for school.  Patient also needs a tb test.  Will fill out paperwork and order labs. Recently had physical 05/24/22. Visual acuity performed.    Relevant past medical, surgical, family and social history reviewed and updated as indicated. Interim medical history since our last visit reviewed. Allergies and medications reviewed and updated.  Review of Systems  Constitutional: Negative for fever or weight change.  Respiratory: Negative for cough and shortness of breath.   Cardiovascular: Negative for chest pain or palpitations.  Gastrointestinal: Negative for abdominal pain, no bowel changes.  Musculoskeletal: Negative for gait problem or joint swelling.  Skin: positive for rash.  Neurological: Negative for dizziness or headache.  No other specific complaints in a complete review of systems (except as listed in HPI above).      Objective:    BP 110/82   Pulse 95   Temp 98.1 F (36.7 C) (Oral)   Resp 18   Ht 5\' 6"  (1.676 m)   Wt 205 lb 4.8 oz (93.1 kg)   SpO2 98%   BMI 33.14 kg/m   Wt Readings from Last 3 Encounters:  09/16/22 205 lb 4.8 oz (93.1 kg)  07/28/22 202 lb 3.2 oz (91.7 kg)  05/24/22 207 lb 4.8 oz (94 kg)    Physical Exam  Constitutional: Patient appears well-developed and well-nourished. Obese  No distress.  HEENT: head atraumatic, normocephalic, pupils equal and reactive to  light, neck supple, throat within normal limits Cardiovascular: Normal rate, regular rhythm and normal heart sounds.  No murmur heard. No BLE edema. Pulmonary/Chest: Effort normal and breath sounds normal. No respiratory distress. Abdominal: Soft.  There is no tenderness. Skin: rash under arms Psychiatric: Patient has a normal mood and affect. behavior is normal. Judgment and thought content normal.   Results for orders placed or performed in visit on 11/24/21  Lipid panel  Result Value Ref Range   Cholesterol 152 <200 mg/dL   HDL 37 (L) > OR = 50 mg/dL   Triglycerides 629 <528 mg/dL   LDL Cholesterol (Calc) 93 mg/dL (calc)   Total CHOL/HDL Ratio 4.1 <5.0 (calc)   Non-HDL Cholesterol (Calc) 115 <130 mg/dL (calc)  CBC with Differential/Platelet  Result Value Ref Range   WBC 6.0 3.8 - 10.8 Thousand/uL   RBC 5.38 (H) 3.80 - 5.10 Million/uL   Hemoglobin 15.4 11.7 - 15.5 g/dL   HCT 41.3 (H) 24.4 - 01.0 %   MCV 86.4 80.0 - 100.0 fL   MCH 28.6 27.0 - 33.0 pg   MCHC 33.1 32.0 - 36.0 g/dL   RDW 27.2 53.6 - 64.4 %   Platelets 247 140 - 400 Thousand/uL   MPV 9.6 7.5 - 12.5 fL   Neutro Abs 2,592 1,500 - 7,800  cells/uL   Lymphs Abs 2,670 850 - 3,900 cells/uL   Absolute Monocytes 618 200 - 950 cells/uL   Eosinophils Absolute 108 15 - 500 cells/uL   Basophils Absolute 12 0 - 200 cells/uL   Neutrophils Relative % 43.2 %   Total Lymphocyte 44.5 %   Monocytes Relative 10.3 %   Eosinophils Relative 1.8 %   Basophils Relative 0.2 %  COMPLETE METABOLIC PANEL WITH GFR  Result Value Ref Range   Glucose, Bld 82 65 - 99 mg/dL   BUN 10 7 - 25 mg/dL   Creat 1.61 (H) 0.96 - 0.96 mg/dL   eGFR 84 > OR = 60 EA/VWU/[id number]   BUN/Creatinine Ratio 10 6 - 22 (calc)   Sodium 138 135 - 146 mmol/L   Potassium 4.2 3.5 - 5.3 mmol/L   Chloride 103 98 - 110 mmol/L   CO2 24 20 - 32 mmol/L   Calcium 9.8 8.6 - 10.2 mg/dL   Total Protein 7.1 6.1 - 8.1 g/dL   Albumin 4.6 3.6 - 5.1 g/dL   Globulin 2.5 1.9 - 3.7  g/dL (calc)   AG Ratio 1.8 1.0 - 2.5 (calc)   Total Bilirubin 0.4 0.2 - 1.2 mg/dL   Alkaline phosphatase (APISO) 78 31 - 125 U/L   AST 20 10 - 30 U/L   ALT 19 6 - 29 U/L  Hemoglobin A1c  Result Value Ref Range   Hgb A1c MFr Bld 5.0 <5.7 % of total Hgb   Mean Plasma Glucose 97 mg/dL   eAG (mmol/L) 5.4 mmol/L  Hepatitis C antibody  Result Value Ref Range   Hepatitis C Ab NON-REACTIVE NON-REACTIVE  HIV Antibody (routine testing w rflx)  Result Value Ref Range   HIV 1&2 Ab, 4th Generation NON-REACTIVE NON-REACTIVE      Assessment & Plan:   Problem List Items Addressed This Visit   None Visit Diagnoses     Rash    -  Primary   referral to dermatology   Relevant Orders   Ambulatory referral to Dermatology   Screening-pulmonary TB       Relevant Orders   QuantiFERON-TB Gold Plus   Encounter for completion of form with patient       paper work filled out for school, labs ordered   Screening for diabetes mellitus       Relevant Orders   COMPLETE METABOLIC PANEL WITH GFR   Hemoglobin A1c   Screening for deficiency anemia       Relevant Orders   CBC with Differential/Platelet        Follow up plan: Return if symptoms worsen or fail to improve.      -Reviewed Health Maintenance: yes

## 2022-10-06 ENCOUNTER — Encounter: Payer: Self-pay | Admitting: Nurse Practitioner

## 2022-10-24 LAB — CBC WITH DIFFERENTIAL/PLATELET
Absolute Monocytes: 458 cells/uL (ref 200–950)
Basophils Absolute: 18 cells/uL (ref 0–200)
Basophils Relative: 0.3 %
Eosinophils Absolute: 128 cells/uL (ref 15–500)
Eosinophils Relative: 2.1 %
HCT: 46.3 % — ABNORMAL HIGH (ref 35.0–45.0)
Hemoglobin: 16.4 g/dL — ABNORMAL HIGH (ref 11.7–15.5)
Lymphs Abs: 1854 cells/uL (ref 850–3900)
MCH: 32.7 pg (ref 27.0–33.0)
MCHC: 35.4 g/dL (ref 32.0–36.0)
MCV: 92.2 fL (ref 80.0–100.0)
MPV: 9.7 fL (ref 7.5–12.5)
Monocytes Relative: 7.5 %
Neutro Abs: 3642 cells/uL (ref 1500–7800)
Neutrophils Relative %: 59.7 %
Platelets: 242 10*3/uL (ref 140–400)
RBC: 5.02 10*6/uL (ref 3.80–5.10)
RDW: 12 % (ref 11.0–15.0)
Total Lymphocyte: 30.4 %
WBC: 6.1 10*3/uL (ref 3.8–10.8)

## 2022-10-24 LAB — QUANTIFERON-TB GOLD PLUS
Mitogen-NIL: >10 IU/mL
NIL: 0.02 IU/mL
QuantiFERON-TB Gold Plus: NEGATIVE
TB1-NIL: 0 IU/mL
TB2-NIL: 0 [IU]/mL

## 2022-10-24 LAB — COMPLETE METABOLIC PANEL WITH GFR
AG Ratio: 2.2 (calc) (ref 1.0–2.5)
ALT: 17 U/L (ref 6–29)
AST: 15 U/L (ref 10–30)
Albumin: 4.7 g/dL (ref 3.6–5.1)
Alkaline phosphatase (APISO): 73 U/L (ref 31–125)
BUN: 12 mg/dL (ref 7–25)
CO2: 24 mmol/L (ref 20–32)
Calcium: 9.6 mg/dL (ref 8.6–10.2)
Chloride: 103 mmol/L (ref 98–110)
Creat: 0.93 mg/dL (ref 0.50–0.96)
Globulin: 2.1 g/dL (calc) (ref 1.9–3.7)
Glucose, Bld: 87 mg/dL (ref 65–99)
Potassium: 4.1 mmol/L (ref 3.5–5.3)
Sodium: 138 mmol/L (ref 135–146)
Total Bilirubin: 0.6 mg/dL (ref 0.2–1.2)
Total Protein: 6.8 g/dL (ref 6.1–8.1)
eGFR: 90 mL/min/{1.73_m2} (ref >=60)

## 2022-10-24 LAB — HEMOGLOBIN A1C
Hgb A1c MFr Bld: 4.7 %{Hb} (ref <5.7)
Mean Plasma Glucose: 88 mg/dL
eAG (mmol/L): 4.9 mmol/L

## 2023-03-03 ENCOUNTER — Ambulatory Visit: Payer: Self-pay | Admitting: Podiatry

## 2023-06-21 ENCOUNTER — Encounter: Payer: Self-pay | Admitting: Nurse Practitioner

## 2023-06-21 ENCOUNTER — Ambulatory Visit (INDEPENDENT_AMBULATORY_CARE_PROVIDER_SITE_OTHER): Admitting: Nurse Practitioner

## 2023-06-21 VITALS — BP 128/68 | HR 111 | Temp 98.2°F | Ht 66.0 in | Wt 201.8 lb

## 2023-06-21 DIAGNOSIS — J069 Acute upper respiratory infection, unspecified: Secondary | ICD-10-CM

## 2023-06-21 DIAGNOSIS — J40 Bronchitis, not specified as acute or chronic: Secondary | ICD-10-CM | POA: Diagnosis not present

## 2023-06-21 LAB — POC COVID19/FLU A&B COMBO
Covid Antigen, POC: NEGATIVE
Influenza A Antigen, POC: NEGATIVE
Influenza B Antigen, POC: NEGATIVE

## 2023-06-21 LAB — POCT RAPID STREP A (OFFICE): Rapid Strep A Screen: NEGATIVE

## 2023-06-21 MED ORDER — HYDROCOD POLI-CHLORPHE POLI ER 10-8 MG/5ML PO SUER
5.0000 mL | Freq: Two times a day (BID) | ORAL | 0 refills | Status: AC | PRN
Start: 2023-06-21 — End: ?

## 2023-06-21 MED ORDER — AZITHROMYCIN 250 MG PO TABS
ORAL_TABLET | ORAL | 0 refills | Status: AC
Start: 2023-06-21 — End: 2023-06-26

## 2023-06-21 MED ORDER — BENZONATATE 100 MG PO CAPS: 200.0000 mg | ORAL_CAPSULE | Freq: Two times a day (BID) | ORAL | 0 refills | Status: AC | PRN

## 2023-06-21 MED ORDER — ALBUTEROL SULFATE HFA 108 (90 BASE) MCG/ACT IN AERS
2.0000 | INHALATION_SPRAY | Freq: Four times a day (QID) | RESPIRATORY_TRACT | 0 refills | Status: AC | PRN
Start: 2023-06-21 — End: ?

## 2023-06-21 NOTE — Progress Notes (Signed)
 BP 128/68 (BP Location: Left Arm, Patient Position: Sitting, Cuff Size: Normal)   Pulse (!) 111   Temp 98.2 F (36.8 C) (Oral)   Ht 5\' 6"  (1.676 m)   Wt 201 lb 12.8 oz (91.5 kg)   SpO2 96%   BMI 32.57 kg/m    Subjective:    Patient ID: Kaitlin Larsen, adult    DOB: 05/31/2000, 23 y.o.   MRN: [id number]  HPI: Kaitlin Larsen is a 23 y.o. adult  Chief Complaint  Patient presents with   URI    Onset 2 days, congestion, sinus pressure, sore throat, cough, low grade fever, headache, body aches.    Discussed the use of AI scribe software for clinical note transcription with the patient, who gave verbal consent to proceed.  History of Present Illness Kaitlin Larsen "Daneil Dunker" is a 23 year old female who presents with upper respiratory symptoms.  He has been experiencing upper respiratory symptoms for the past two days, including congestion, sinus pressure, sore throat, cough, low-grade fever, headache, and body aches. Symptoms began on Monday around 11:00 AM.  He describes significant sinus pressure and a migraine that developed Monday night while working an overtime shift. He also experiences nasal congestion alternating with a runny nose and drainage down the back of his throat, which he finds uncomfortable.  He has been taking over-the-counter cold and flu medication to manage his symptoms. He reports feeling like his skin is 'feeling like ice' and mentions that his girlfriend noted he felt like he was 'on fire.'  No known medication allergies. He has previously used an inhaler and mentions feeling mentally foggy, stating 'the brain's just not working.'  COVID-19, flu, and strep tests have all returned negative results. He confirms symptoms of cough, nasal congestion, fever, sore throat, body aches, headache, and sinus pressure. He also reports feeling warm and experiencing a wheeze.         06/21/2023    1:56 PM 09/16/2022    2:20 PM 07/28/2022    1:39 PM  Depression screen PHQ 2/9   Decreased Interest 0 0 0  Down, Depressed, Hopeless 0 0 0  PHQ - 2 Score 0 0 0  Altered sleeping 0  0  Tired, decreased energy 0  0  Change in appetite 0  0  Feeling bad or failure about yourself  0  0  Trouble concentrating 0  0  Moving slowly or fidgety/restless 0  0  Suicidal thoughts 0  0  PHQ-9 Score 0  0  Difficult doing work/chores Not difficult at all  Not difficult at all    Relevant past medical, surgical, family and social history reviewed and updated as indicated. Interim medical history since our last visit reviewed. Allergies and medications reviewed and updated.  Review of Systems  Ten systems reviewed and is negative except as mentioned in HPI      Objective:      BP 128/68 (BP Location: Left Arm, Patient Position: Sitting, Cuff Size: Normal)   Pulse (!) 111   Temp 98.2 F (36.8 C) (Oral)   Ht 5\' 6"  (1.676 m)   Wt 201 lb 12.8 oz (91.5 kg)   SpO2 96%   BMI 32.57 kg/m    Wt Readings from Last 3 Encounters:  06/21/23 201 lb 12.8 oz (91.5 kg)  09/16/22 205 lb 4.8 oz (93.1 kg)  07/28/22 202 lb 3.2 oz (91.7 kg)    Physical Exam Vitals reviewed.  Constitutional:      Appearance:  Normal appearance.  HENT:     Head: Normocephalic.     Right Ear: Tympanic membrane normal.     Left Ear: Tympanic membrane normal.     Nose: Congestion and rhinorrhea present.     Right Sinus: No maxillary sinus tenderness or frontal sinus tenderness.     Left Sinus: No maxillary sinus tenderness or frontal sinus tenderness.     Mouth/Throat:     Mouth: Mucous membranes are moist.     Pharynx: Oropharynx is clear. No oropharyngeal exudate or posterior oropharyngeal erythema.  Eyes:     Extraocular Movements: Extraocular movements intact.     Conjunctiva/sclera: Conjunctivae normal.     Pupils: Pupils are equal, round, and reactive to light.  Cardiovascular:     Rate and Rhythm: Normal rate and regular rhythm.  Pulmonary:     Effort: Pulmonary effort is normal.      Breath sounds: Wheezing and rhonchi present.  Lymphadenopathy:     Cervical: No cervical adenopathy.  Skin:    General: Skin is warm and dry.  Neurological:     General: No focal deficit present.     Mental Status: He is alert and oriented to person, place, and time. Mental status is at baseline.  Psychiatric:        Mood and Affect: Mood normal.        Behavior: Behavior normal.        Thought Content: Thought content normal.        Judgment: Judgment normal.       Results for orders placed or performed in visit on 06/21/23  POC Covid19/Flu A&B Antigen   Collection Time: 06/21/23  2:20 PM  Result Value Ref Range   Influenza A Antigen, POC Negative Negative   Influenza B Antigen, POC Negative Negative   Covid Antigen, POC Negative Negative  POCT rapid strep A   Collection Time: 06/21/23  2:21 PM  Result Value Ref Range   Rapid Strep A Screen Negative Negative          Assessment & Plan:   Problem List Items Addressed This Visit   None Visit Diagnoses       Viral upper respiratory tract infection    -  Primary   Relevant Medications   albuterol (VENTOLIN HFA) 108 (90 Base) MCG/ACT inhaler   azithromycin (ZITHROMAX) 250 MG tablet   chlorpheniramine-HYDROcodone (TUSSIONEX) 10-8 MG/5ML   benzonatate  (TESSALON ) 100 MG capsule   Other Relevant Orders   POCT rapid strep A (Completed)   POC Covid19/Flu A&B Antigen (Completed)     Bronchitis       Relevant Medications   albuterol (VENTOLIN HFA) 108 (90 Base) MCG/ACT inhaler   azithromycin (ZITHROMAX) 250 MG tablet   chlorpheniramine-HYDROcodone (TUSSIONEX) 10-8 MG/5ML   benzonatate  (TESSALON ) 100 MG capsule        Assessment and Plan Assessment & Plan Acute upper respiratory infection/bronchitis Acute upper respiratory infection with symptoms of congestion, sinus pressure, sore throat, cough, low-grade fever, headache, and body aches for two days. COVID, flu, and strep tests are negative, suggesting a viral  etiology. Wheezing noted on examination. - Recommend plain Mucinex to thin secretions. - Advise use of Flonase nasal spray to reduce nasal congestion. - Suggest Zyrtec or Claritin for allergy relief. - Prescribe albuterol inhaler for wheezing. - Prescribe azithromycin (Z-Pak) as a precautionary antibiotic. - Prescribe Tussenex cough syrup for symptomatic relief, noting potential drowsiness. - Prescribe Tessalon  perles to reduce cough reflex. - Encourage hydration. - Request  follow-up message in a couple of days to assess progress.        Follow up plan: Return if symptoms worsen or fail to improve.

## 2023-06-26 ENCOUNTER — Encounter: Payer: Self-pay | Admitting: Nurse Practitioner

## 2023-08-09 ENCOUNTER — Encounter: Payer: Self-pay | Admitting: Physician Assistant

## 2023-08-09 ENCOUNTER — Ambulatory Visit: Admitting: Physician Assistant

## 2023-08-09 VITALS — BP 124/83 | HR 89

## 2023-08-09 DIAGNOSIS — L308 Other specified dermatitis: Secondary | ICD-10-CM

## 2023-08-09 DIAGNOSIS — L249 Irritant contact dermatitis, unspecified cause: Secondary | ICD-10-CM

## 2023-08-09 MED ORDER — TRIAMCINOLONE ACETONIDE 0.1 % EX CREA: 1.0000 | TOPICAL_CREAM | Freq: Two times a day (BID) | CUTANEOUS | 2 refills | Status: AC | PRN

## 2023-08-09 MED ORDER — CLOTRIMAZOLE-BETAMETHASONE 1-0.05 % EX CREA: 1.0000 | TOPICAL_CREAM | Freq: Two times a day (BID) | CUTANEOUS | 4 refills | Status: AC

## 2023-08-09 MED ORDER — CLOTRIMAZOLE-BETAMETHASONE 1-0.05 % EX CREA: TOPICAL_CREAM | Freq: Two times a day (BID) | CUTANEOUS | Status: DC

## 2023-08-09 NOTE — Patient Instructions (Addendum)
 Pt to be worked in when having a flare  Important Information   Due to recent changes in healthcare laws, you may see results of your pathology and/or laboratory studies on MyChart before the doctors have had a chance to review them. We understand that in some cases there may be results that are confusing or concerning to you. Please understand that not all results are received at the same time and often the doctors may need to interpret multiple results in order to provide you with the best plan of care or course of treatment. Therefore, we ask that you please give us  2 business days to thoroughly review all your results before contacting the office for clarification. Should we see a critical lab result, you will be contacted sooner.     If You Need Anything After Your Visit   If you have any questions or concerns for your doctor, please call our main line at [phone]. If no one answers, please leave a voicemail as directed and we will return your call as soon as possible. Messages left after 4 pm will be answered the following business day.    You may also send us  a message via MyChart. We typically respond to MyChart messages within 1-2 business days.  For prescription refills, please ask your pharmacy to contact our office. Our fax number is [phone].  If you have an urgent issue when the clinic is closed that cannot wait until the next business day, you can page your doctor at the number below.     Please note that while we do our best to be available for urgent issues outside of office hours, we are not available 24/7.    If you have an urgent issue and are unable to reach us , you may choose to seek medical care at your doctor's office, retail clinic, urgent care center, or emergency room.   If you have a medical emergency, please immediately call 911 or go to the emergency department. In the event of inclement weather, please call our main line at [phone] for an update on  the status of any delays or closures.  Dermatology Medication Tips: Please keep the boxes that topical medications come in in order to help keep track of the instructions about where and how to use these. Pharmacies typically print the medication instructions only on the boxes and not directly on the medication tubes.   If your medication is too expensive, please contact our office at [phone] or send us  a message through MyChart.    We are unable to tell what your co-pay for medications will be in advance as this is different depending on your insurance coverage. However, we may be able to find a substitute medication at lower cost or fill out paperwork to get insurance to cover a needed medication.    If a prior authorization is required to get your medication covered by your insurance company, please allow us  1-2 business days to complete this process.   Drug prices often vary depending on where the prescription is filled and some pharmacies may offer cheaper prices.   The website www.goodrx.com contains coupons for medications through different pharmacies. The prices here do not account for what the cost may be with help from insurance (it may be cheaper with your insurance), but the website can give you the price if you did not use any insurance.  - You can print the associated coupon and take it with your prescription to the pharmacy.  -  You may also stop by our office during regular business hours and pick up a GoodRx coupon card.  - If you need your prescription sent electronically to a different pharmacy, notify our office through [facility] or by phone at [phone]

## 2023-08-09 NOTE — Progress Notes (Signed)
   New Patient Visit   Subjective  Kaitlin Larsen is a 23 y.o. adult who presents for the following: rash  Patient has history of rash under his bilateral axilla, left breast and just recently his abdomen. Abdominal rash is only present today. Axillary rash ongoing now around a year ago that comes and goes. Used clotrimazole /betamethasone  which helped but it keeps coming back. Has switched deodorants and soaps. Uses Gain detergent.   The following portions of the chart were reviewed this encounter and updated as appropriate: medications, allergies, medical history  Review of Systems:  No other skin or systemic complaints except as noted in HPI or Assessment and Plan.  Objective  Well appearing patient in no apparent distress; mood and affect are within normal limits.  A focused examination was performed of the following areas: Face, axilla, chest and abdomen.   Relevant exam findings are noted in the Assessment and Plan.    Assessment & Plan   IRRITANT CONTACT DERMATITIS abdomen Exam: erythematous scaly patch   Treatment Plan: -Triamcinolone  cream to affected area bid until resolved  -Strongly recommend Gentle soaps, detergents - recommended Vanicream body wash and unscented deodorant and detergents.   DERMATITIS UNSPECIFIED - bilateral axilla  Exam: Clear today - photos on iPhone were reviewed.   Treatment Plan: - Can call if flaring and we can further re-evaluate - +/- biopsy - Clotrimazole /betamethasone  cream to axilla when flaring (only b/c it works - not my first choice as RX)  - Strongly recommend Gentle soaps, detergents - recommended Vanicream body wash and unscented deodorant and detergents.  .    OTHER SPECIFIED DERMATITIS   IRRITANT CONTACT DERMATITIS, UNSPECIFIED TRIGGER    No follow-ups on file.  I, Darice Smock, CMA, am acting as scribe for [employer], PA-C.   Documentation: I have reviewed the above documentation for accuracy and  completeness, and I agree with the above.  Lashauna Arpin K, PA-C
